# Patient Record
Sex: Male | Born: 1960 | Race: White | Hispanic: No | Marital: Married | Smoking: Former smoker
Health system: Southern US, Community
[De-identification: ages and names within clinical notes are randomized; demographics above are authoritative.]

## PROBLEM LIST (undated history)

## (undated) DIAGNOSIS — I10 Essential (primary) hypertension: Secondary | ICD-10-CM

## (undated) DIAGNOSIS — E7800 Pure hypercholesterolemia, unspecified: Secondary | ICD-10-CM

## (undated) DIAGNOSIS — E119 Type 2 diabetes mellitus without complications: Secondary | ICD-10-CM

## (undated) DIAGNOSIS — R569 Unspecified convulsions: Secondary | ICD-10-CM

## (undated) DIAGNOSIS — N200 Calculus of kidney: Secondary | ICD-10-CM

## (undated) HISTORY — PX: TOTAL KNEE ARTHROPLASTY: SHX125

## (undated) HISTORY — PX: SHOULDER ACROMIOPLASTY: SHX6093

## (undated) HISTORY — PX: SPINE SURGERY: SHX786

---

## 2015-06-24 ENCOUNTER — Emergency Department (HOSPITAL_COMMUNITY)
Admission: EM | Admit: 2015-06-24 | Discharge: 2015-06-24 | Disposition: A | Discharge status: Home / Self Care | Payer: Non-veteran care | Attending: Emergency Medicine | Admitting: Emergency Medicine

## 2015-06-24 ENCOUNTER — Emergency Department (HOSPITAL_COMMUNITY): Payer: Non-veteran care

## 2015-06-24 ENCOUNTER — Encounter (HOSPITAL_COMMUNITY): Payer: Self-pay | Admitting: Emergency Medicine

## 2015-06-24 DIAGNOSIS — Z87442 Personal history of urinary calculi: Secondary | ICD-10-CM | POA: Insufficient documentation

## 2015-06-24 DIAGNOSIS — E7800 Pure hypercholesterolemia, unspecified: Secondary | ICD-10-CM | POA: Diagnosis not present

## 2015-06-24 DIAGNOSIS — R1013 Epigastric pain: Secondary | ICD-10-CM | POA: Insufficient documentation

## 2015-06-24 DIAGNOSIS — Z7982 Long term (current) use of aspirin: Secondary | ICD-10-CM | POA: Insufficient documentation

## 2015-06-24 DIAGNOSIS — Z79899 Other long term (current) drug therapy: Secondary | ICD-10-CM | POA: Insufficient documentation

## 2015-06-24 DIAGNOSIS — Z7984 Long term (current) use of oral hypoglycemic drugs: Secondary | ICD-10-CM | POA: Insufficient documentation

## 2015-06-24 DIAGNOSIS — I10 Essential (primary) hypertension: Secondary | ICD-10-CM | POA: Insufficient documentation

## 2015-06-24 DIAGNOSIS — E119 Type 2 diabetes mellitus without complications: Secondary | ICD-10-CM | POA: Insufficient documentation

## 2015-06-24 DIAGNOSIS — R079 Chest pain, unspecified: Secondary | ICD-10-CM | POA: Insufficient documentation

## 2015-06-24 DIAGNOSIS — Z87891 Personal history of nicotine dependence: Secondary | ICD-10-CM | POA: Insufficient documentation

## 2015-06-24 HISTORY — DX: Type 2 diabetes mellitus without complications: E11.9

## 2015-06-24 HISTORY — DX: Unspecified convulsions: R56.9

## 2015-06-24 HISTORY — DX: Essential (primary) hypertension: I10

## 2015-06-24 HISTORY — DX: Calculus of kidney: N20.0

## 2015-06-24 HISTORY — DX: Pure hypercholesterolemia, unspecified: E78.00

## 2015-06-24 LAB — BASIC METABOLIC PANEL
ANION GAP: 10 (ref 5–15)
BUN: 10 mg/dL (ref 6–20)
CHLORIDE: 106 mmol/L (ref 101–111)
CO2: 23 mmol/L (ref 22–32)
CREATININE: 0.7 mg/dL (ref 0.61–1.24)
Calcium: 9.4 mg/dL (ref 8.9–10.3)
GFR calc non Af Amer: >60 mL/min (ref >60)
Glucose, Bld: 174 mg/dL — ABNORMAL HIGH (ref 65–99)
POTASSIUM: 4 mmol/L (ref 3.5–5.1)
SODIUM: 139 mmol/L (ref 135–145)

## 2015-06-24 LAB — CBG MONITORING, ED: GLUCOSE-CAPILLARY: 75 mg/dL (ref 65–99)

## 2015-06-24 LAB — LIPASE, BLOOD: LIPASE: 32 U/L (ref 11–51)

## 2015-06-24 LAB — I-STAT TROPONIN, ED
TROPONIN I, POC: 0 ng/mL (ref 0.00–0.08)
Troponin i, poc: 0 ng/mL (ref 0.00–0.08)

## 2015-06-24 LAB — CBC
HCT: 42.3 % (ref 39.0–52.0)
HEMOGLOBIN: 14.5 g/dL (ref 13.0–17.0)
MCH: 30.9 pg (ref 26.0–34.0)
MCHC: 34.3 g/dL (ref 30.0–36.0)
MCV: 90.2 fL (ref 78.0–100.0)
PLATELETS: 302 10*3/uL (ref 150–400)
RBC: 4.69 MIL/uL (ref 4.22–5.81)
RDW: 12.2 % (ref 11.5–15.5)
WBC: 7.4 10*3/uL (ref 4.0–10.5)

## 2015-06-24 LAB — HEPATIC FUNCTION PANEL
ALBUMIN: 3.8 g/dL (ref 3.5–5.0)
ALK PHOS: 55 U/L (ref 38–126)
ALT: 18 U/L (ref 17–63)
AST: 22 U/L (ref 15–41)
Bilirubin, Direct: <0.1 mg/dL — ABNORMAL LOW (ref 0.1–0.5)
TOTAL PROTEIN: 6.8 g/dL (ref 6.5–8.1)
Total Bilirubin: 0.4 mg/dL (ref 0.3–1.2)

## 2015-06-24 LAB — D-DIMER, QUANTITATIVE (NOT AT ARMC): D DIMER QUANT: 0.81 ug{FEU}/mL — AB (ref 0.00–0.50)

## 2015-06-24 MED ORDER — HYDROCODONE-ACETAMINOPHEN 5-325 MG PO TABS
1.0000 | ORAL_TABLET | Freq: Once | ORAL | Status: AC
  Administered 2015-06-24: 1 via ORAL
  Filled 2015-06-24: qty 1

## 2015-06-24 MED ORDER — IOPAMIDOL (ISOVUE-370) INJECTION 76%
INTRAVENOUS | Status: AC
  Administered 2015-06-24: 90 mL
  Filled 2015-06-24: qty 100

## 2015-06-24 MED ORDER — ACETAMINOPHEN 325 MG PO TABS
650.0000 mg | ORAL_TABLET | Freq: Once | ORAL | Status: DC
  Filled 2015-06-24: qty 2

## 2015-06-24 MED ORDER — GI COCKTAIL ~~LOC~~
30.0000 mL | Freq: Once | ORAL | Status: AC
  Administered 2015-06-24: 30 mL via ORAL
  Filled 2015-06-24: qty 30

## 2015-06-24 NOTE — Discharge Instructions (Signed)
Nonspecific Chest Pain  °Chest pain can be caused by many different conditions. There is always a chance that your pain could be related to something serious, such as a heart attack or a blood clot in your lungs. Chest pain can also be caused by conditions that are not life-threatening. If you have chest pain, it is very important to follow up with your health care provider. °CAUSES  °Chest pain can be caused by: °· Heartburn. °· Pneumonia or bronchitis. °· Anxiety or stress. °· Inflammation around your heart (pericarditis) or lung (pleuritis or pleurisy). °· A blood clot in your lung. °· A collapsed lung (pneumothorax). It can develop suddenly on its own (spontaneous pneumothorax) or from trauma to the chest. °· Shingles infection (varicella-zoster virus). °· Heart attack. °· Damage to the bones, muscles, and cartilage that make up your chest wall. This can include: °¨ Bruised bones due to injury. °¨ Strained muscles or cartilage due to frequent or repeated coughing or overwork. °¨ Fracture to one or more ribs. °¨ Sore cartilage due to inflammation (costochondritis). °RISK FACTORS  °Risk factors for chest pain may include: °· Activities that increase your risk for trauma or injury to your chest. °· Respiratory infections or conditions that cause frequent coughing. °· Medical conditions or overeating that can cause heartburn. °· Heart disease or family history of heart disease. °· Conditions or health behaviors that increase your risk of developing a blood clot. °· Having had chicken pox (varicella zoster). °SIGNS AND SYMPTOMS °Chest pain can feel like: °· Burning or tingling on the surface of your chest or deep in your chest. °· Crushing, pressure, aching, or squeezing pain. °· Dull or sharp pain that is worse when you move, cough, or take a deep breath. °· Pain that is also felt in your back, neck, shoulder, or arm, or pain that spreads to any of these areas. °Your chest pain may come and go, or it may stay  constant. °DIAGNOSIS °Lab tests or other studies may be needed to find the cause of your pain. Your health care provider may have you take a test called an ambulatory ECG (electrocardiogram). An ECG records your heartbeat patterns at the time the test is performed. You may also have other tests, such as: °· Transthoracic echocardiogram (TTE). During echocardiography, sound waves are used to create a picture of all of the heart structures and to look at how blood flows through your heart. °· Transesophageal echocardiogram (TEE). This is a more advanced imaging test that obtains images from inside your body. It allows your health care provider to see your heart in finer detail. °· Cardiac monitoring. This allows your health care provider to monitor your heart rate and rhythm in real time. °· Holter monitor. This is a portable device that records your heartbeat and can help to diagnose abnormal heartbeats. It allows your health care provider to track your heart activity for several days, if needed. °· Stress tests. These can be done through exercise or by taking medicine that makes your heart beat more quickly. °· Blood tests. °· Imaging tests. °TREATMENT  °Your treatment depends on what is causing your chest pain. Treatment may include: °· Medicines. These may include: °¨ Acid blockers for heartburn. °¨ Anti-inflammatory medicine. °¨ Pain medicine for inflammatory conditions. °¨ Antibiotic medicine, if an infection is present. °¨ Medicines to dissolve blood clots. °¨ Medicines to treat coronary artery disease. °· Supportive care for conditions that do not require medicines. This may include: °¨ Resting. °¨ Applying heat   or cold packs to injured areas. °¨ Limiting activities until pain decreases. °HOME CARE INSTRUCTIONS °· If you were prescribed an antibiotic medicine, finish it all even if you start to feel better. °· Avoid any activities that bring on chest pain. °· Do not use any tobacco products, including  cigarettes, chewing tobacco, or electronic cigarettes. If you need help quitting, ask your health care provider. °· Do not drink alcohol. °· Take medicines only as directed by your health care provider. °· Keep all follow-up visits as directed by your health care provider. This is important. This includes any further testing if your chest pain does not go away. °· If heartburn is the cause for your chest pain, you may be told to keep your head raised (elevated) while sleeping. This reduces the chance that acid will go from your stomach into your esophagus. °· Make lifestyle changes as directed by your health care provider. These may include: °¨ Getting regular exercise. Ask your health care provider to suggest some activities that are safe for you. °¨ Eating a heart-healthy diet. A registered dietitian can help you to learn healthy eating options. °¨ Maintaining a healthy weight. °¨ Managing diabetes, if necessary. °¨ Reducing stress. °SEEK MEDICAL CARE IF: °· Your chest pain does not go away after treatment. °· You have a rash with blisters on your chest. °· You have a fever. °SEEK IMMEDIATE MEDICAL CARE IF:  °· Your chest pain is worse. °· You have an increasing cough, or you cough up blood. °· You have severe abdominal pain. °· You have severe weakness. °· You faint. °· You have chills. °· You have sudden, unexplained chest discomfort. °· You have sudden, unexplained discomfort in your arms, back, neck, or jaw. °· You have shortness of breath at any time. °· You suddenly start to sweat, or your skin gets clammy. °· You feel nauseous or you vomit. °· You suddenly feel light-headed or dizzy. °· Your heart begins to beat quickly, or it feels like it is skipping beats. °These symptoms may represent a serious problem that is an emergency. Do not wait to see if the symptoms will go away. Get medical help right away. Call your local emergency services (911 in the [place] Do not drive yourself to the hospital. °  °This  information is not intended to replace advice given to you by your health care provider. Make sure you discuss any questions you have with your health care provider. °  °Document Released: 12/05/2004 Document Revised: 03/18/2014 Document Reviewed: 10/01/2013 °Elsevier Interactive Patient Education ©2016 [employer] ° °

## 2015-06-24 NOTE — ED Notes (Signed)
Patient transported to CT 

## 2015-06-24 NOTE — ED Notes (Signed)
Sharp stabbing chest pain started last night, with shortness of breath pain is 5/10. Took a baby ASA this am.

## 2015-06-24 NOTE — ED Notes (Signed)
Patient transported to X-ray 

## 2015-06-24 NOTE — ED Notes (Addendum)
The pt states he is developing a headache, feels like his blood sugar may be low, cbg checked 75, edp aware and VO obtained to give meal tray. Pt is eating comfortably now with family at bedside

## 2015-06-24 NOTE — ED Provider Notes (Signed)
CSN: [id number]     Arrival date & time 06/24/15  8469 History   First MD Initiated Contact with Patient 06/24/15 1002     Chief Complaint  Patient presents with  . Chest Pain     Patient is a 55 y.o. male presenting with chest pain. The history is provided by the patient. No language interpreter was used.  Chest Pain  Francisco Ford is a 55 y.o. male who presents to the Emergency Department complaining of chest pain.  He reports sharp, left-sided chest pain that began at 9 PM last night. Pain occurred at rest. The severe pain lasted for about 15 minutes and he was doubled over in pain. He has associated shortness of breath. Pain is worse with deep breaths. Pain did improve last night and when he woke up he had central chest heaviness and ongoing shortness of breath. Symptoms improve when he lays down flat. He has a history of hypertension, hyperlipidemia, diabetes. He has no history of cardiac disease but he has extensive family history of early onset coronary artery disease. No history of DVT or PE. No fevers, cough, leg swelling or pain, abdominal pain, vomiting, diarrhea.  Past Medical History  Diagnosis Date  . Diabetes mellitus without complication (HCC)   . Hypertension   . High cholesterol   . Seizures (HCC)     1991  . Kidney stones    Past Surgical History  Procedure Laterality Date  . Total knee arthroplasty    . Spine surgery    . Shoulder acromioplasty     History reviewed. No pertinent family history. Social History  Substance Use Topics  . Smoking status: Former Games developer  . Smokeless tobacco: None  . Alcohol Use: No    Review of Systems  Cardiovascular: Positive for chest pain.  All other systems reviewed and are negative.     Allergies  Review of patient's allergies indicates no known allergies.  Home Medications   Prior to Admission medications   Medication Sig Start Date End Date Taking? Authorizing Provider  aspirin 81 MG tablet Take 81 mg by mouth  daily.   Yes Historical Provider, MD  atorvastatin (LIPITOR) 80 MG tablet Take 40 mg by mouth daily.   Yes Historical Provider, MD  glipiZIDE (GLUCOTROL) 5 MG tablet Take by mouth 2 (two) times daily before a meal.   Yes Historical Provider, MD  hydrOXYzine (ATARAX/VISTARIL) 10 MG tablet Take 10 mg by mouth 3 (three) times daily as needed for anxiety.   Yes Historical Provider, MD  lamoTRIgine (LAMICTAL) 200 MG tablet Take 200 mg by mouth daily.   Yes Historical Provider, MD  losartan (COZAAR) 100 MG tablet Take 50 mg by mouth daily.   Yes Historical Provider, MD  metFORMIN (GLUCOPHAGE) 1000 MG tablet Take 1,000 mg by mouth 2 (two) times daily with a meal.   Yes Historical Provider, MD  sildenafil (VIAGRA) 100 MG tablet Take 100 mg by mouth as needed for erectile dysfunction.   Yes Historical Provider, MD   BP 136/73 mmHg  Pulse 68  Temp(Src) 97.8 F (36.6 C) (Oral)  Resp 20  Ht  (1.778 m)  Wt 184 lb (83.462 kg)  BMI 26.40 kg/m2  SpO2 97% Physical Exam  Constitutional: He is oriented to person, place, and time. He appears well-developed and well-nourished.  HENT:  Head: Normocephalic and atraumatic.  Cardiovascular: Normal rate and regular rhythm.   No murmur heard. Pulmonary/Chest: Effort normal and breath sounds normal. No respiratory distress.  Abdominal: Soft. There is no rebound and no guarding.  Mild to moderate epigastric tenderness  Musculoskeletal: He exhibits no edema or tenderness.  Neurological: He is alert and oriented to person, place, and time.  Skin: Skin is warm and dry.  Psychiatric: He has a normal mood and affect. His behavior is normal.  Nursing note and vitals reviewed.   ED Course  Procedures (including critical care time) Labs Review Labs Reviewed  BASIC METABOLIC PANEL - Abnormal; Notable for the following:    Glucose, Bld 174 (*)    All other components within normal limits  HEPATIC FUNCTION PANEL - Abnormal; Notable for the following:     Bilirubin, Direct <0.1 (*)    All other components within normal limits  D-DIMER, QUANTITATIVE (NOT AT [facility]) - Abnormal; Notable for the following:    D-Dimer, Quant 0.81 (*)    All other components within normal limits  CBC  LIPASE, BLOOD  I-STAT TROPOININ, ED  I-STAT TROPOININ, ED  CBG MONITORING, ED    Imaging Review Dg Chest 2 View  06/24/2015  CLINICAL DATA:  55 year old male EXAM: CHEST - 2 VIEW COMPARISON:  None. FINDINGS: Cardiomediastinal silhouette projects within normal limits in size and contour. No confluent airspace disease, pneumothorax, or pleural effusion. No displaced fracture. Unremarkable appearance of the upper abdomen. IMPRESSION: No radiographic evidence of acute cardiopulmonary disease. Signed, Yvone NeuJaime S. Loreta AveWagner, DO [facility] Electronically Signed   By: Gilmer MorJaime  Wagner D.O.   On: 06/24/2015 10:22   Ct Angio Chest Pe W/cm &/or Wo Cm  06/24/2015  CLINICAL DATA:  Right chest pain since 9 p.m. yesterday. Shortness of breath. EXAM: CT ANGIOGRAPHY CHEST WITH CONTRAST TECHNIQUE: Multidetector CT imaging of the chest was performed using the standard protocol during bolus administration of intravenous contrast. Multiplanar CT image reconstructions and MIPs were obtained to evaluate the vascular anatomy. CONTRAST:  90 cc Isovue 370 COMPARISON:  06/24/2015. FINDINGS: Mediastinum/Lymph Nodes: No pulmonary emboli or thoracic aortic dissection identified. No masses or pathologically enlarged lymph nodes identified. Lungs/Pleura: No pulmonary mass, infiltrate, or effusion. Upper abdomen: No acute findings. Musculoskeletal: Minimal thoracic spine degenerative changes. Review of the MIP images confirms the above findings. IMPRESSION: No pulmonary emboli or other acute abnormality. Electronically Signed   By: Beckie SaltsSteven  Reid M.D.   On: 06/24/2015 13:13   I have personally reviewed and evaluated these images and lab results as part of my  medical decision-making.   EKG Interpretation   Date/Time:  Saturday June 24 2015 09:28:01 EDT Ventricular Rate:  81 PR Interval:  204 QRS Duration: 92 QT Interval:  356 QTC Calculation: 413 R Axis:   30 Text Interpretation:  Sinus rhythm with Premature supraventricular  complexes Otherwise normal ECG Confirmed by Lincoln Brighamees, Liz [phone]([id number]) on 06/24/2015  10:02:02 AM      MDM   Final diagnoses:  Chest pain, unspecified chest pain type   Patient here for evaluation of chest pain. He has multiple cardiac risk factors with a normal EKG and normal troponins 2. No change in his symptoms following the GI cocktail. Discussed with patient option for observation for further cardiac evaluation and patient declines at this time. Plan to DC home with close outpatient follow-up for cardi[place]  Tilden FossaElizabeth Landen Breeland, MD 06/24/15 [phone]

## 2017-01-23 IMAGING — DX DG CHEST 2V
2 series · 2 of 2 positions shown · non-contrast
Comparison: None.

CLINICAL DATA: 54-year-old male

EXAM:
CHEST - 2 VIEW

[chest pa]
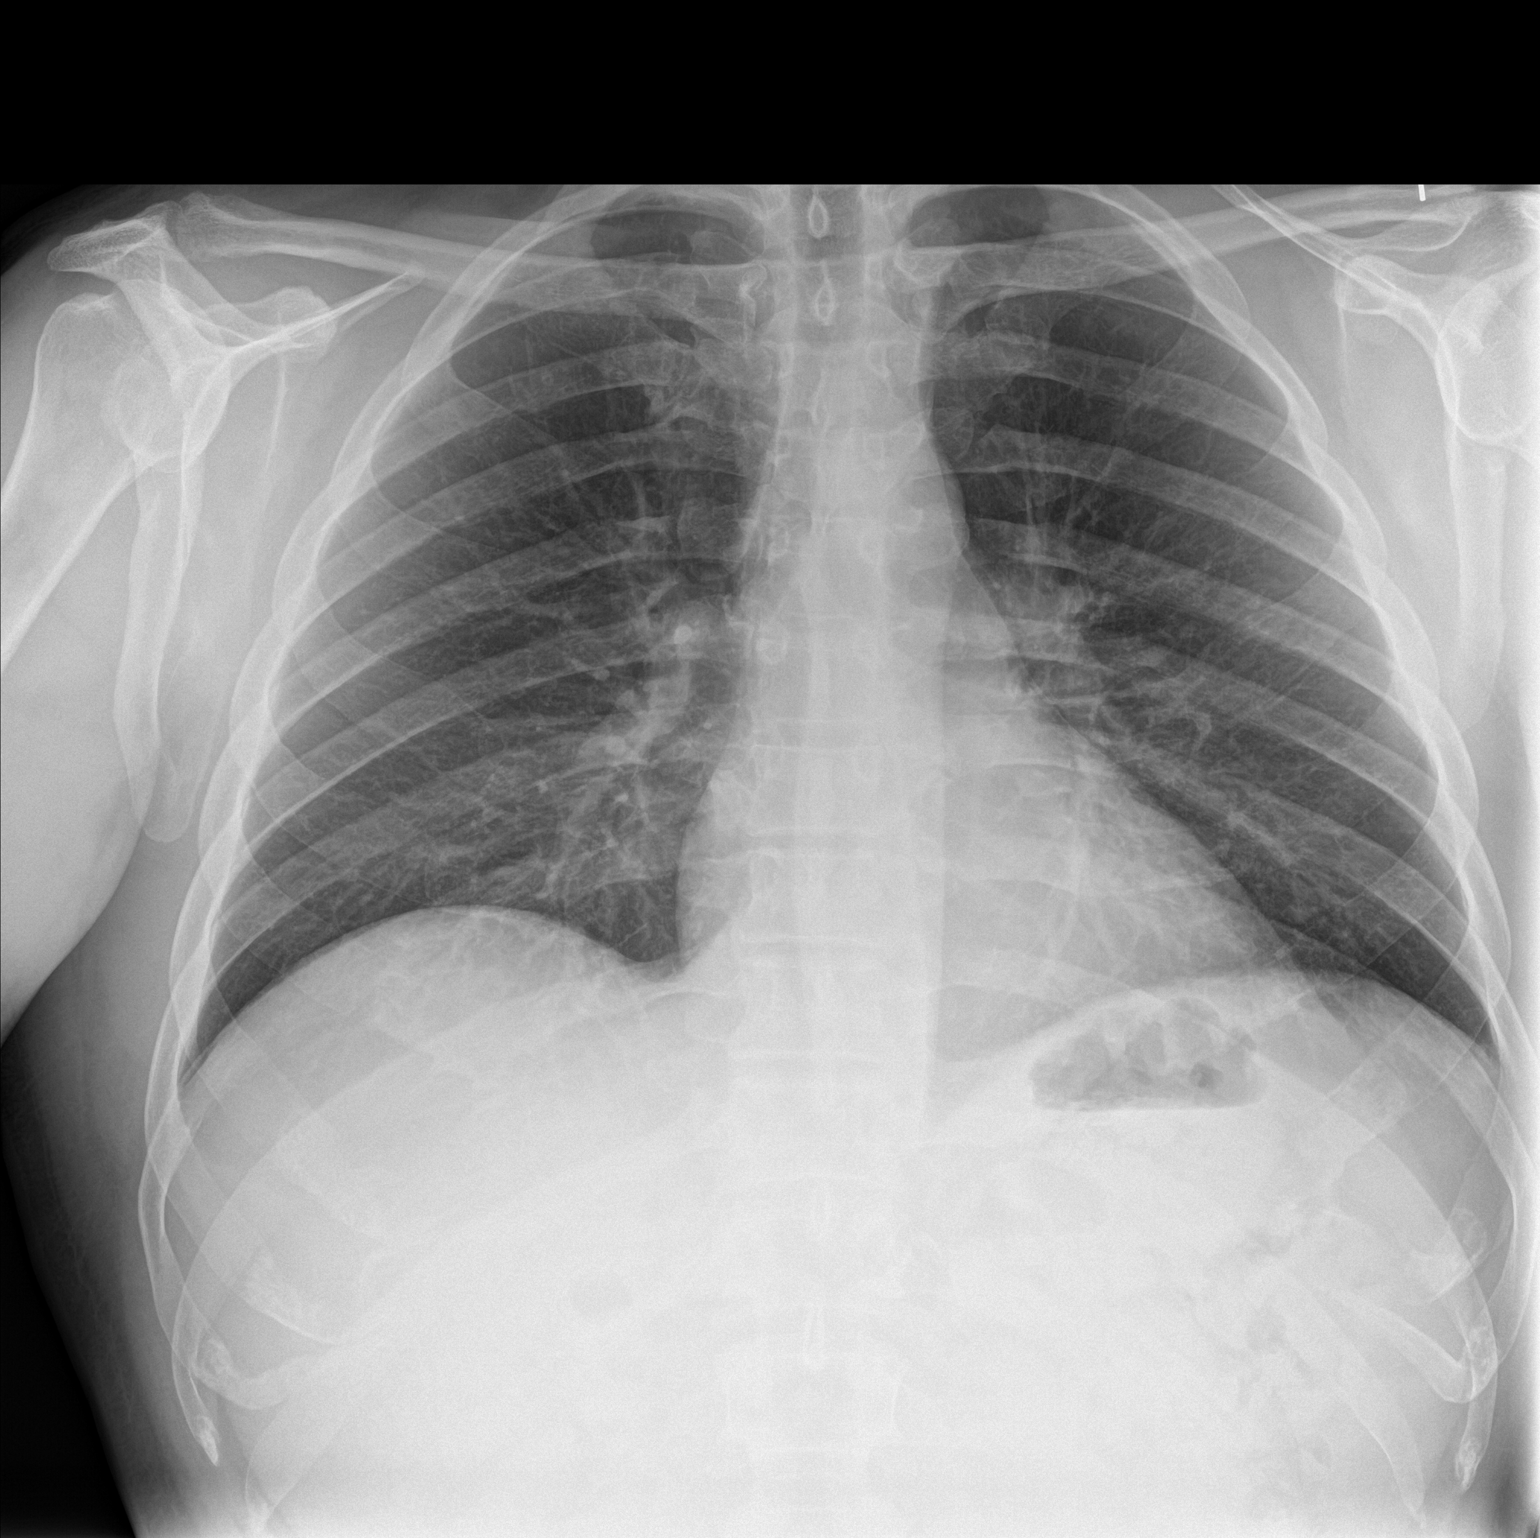

[chest lat]
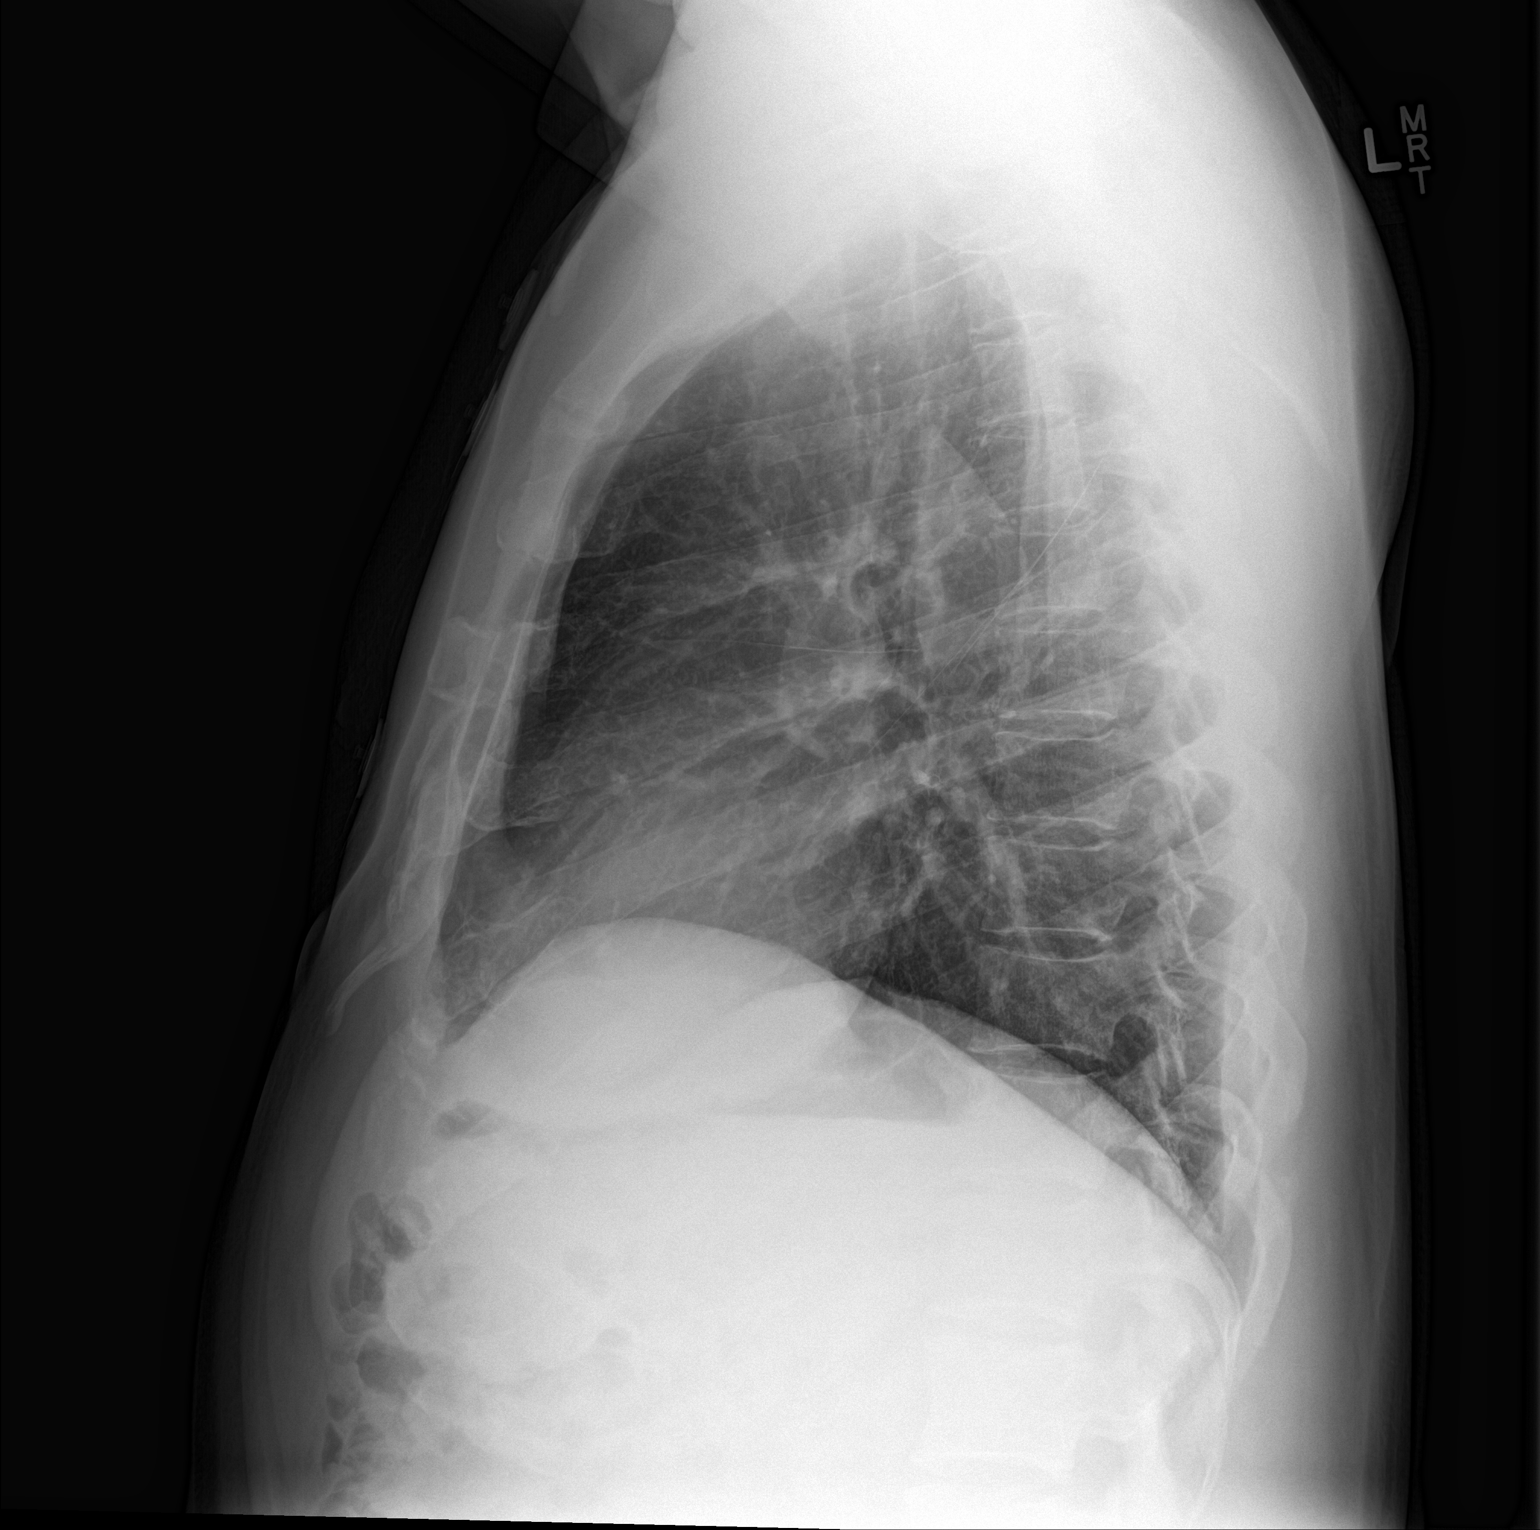

[2 of 2 positions shown; findings below may reference images not displayed]

FINDINGS: Cardiomediastinal silhouette projects within normal limits in size
and contour. No confluent airspace disease, pneumothorax, or pleural
effusion.

No displaced fracture.

Unremarkable appearance of the upper abdomen.
IMPRESSION: No radiographic evidence of acute cardiopulmonary disease.
